# Patient Record
Sex: Female | Born: 1955 | Race: Black or African American | Hispanic: No | Marital: Single | Smoking: Never smoker
Health system: Southern US, Community
[De-identification: ages and names within clinical notes are randomized; demographics above are authoritative.]

## PROBLEM LIST (undated history)

## (undated) DIAGNOSIS — C801 Malignant (primary) neoplasm, unspecified: Secondary | ICD-10-CM

## (undated) DIAGNOSIS — E119 Type 2 diabetes mellitus without complications: Secondary | ICD-10-CM

## (undated) HISTORY — PX: COLON SURGERY: SHX602

## (undated) HISTORY — PX: ABDOMINAL HYSTERECTOMY: SHX81

---

## 2010-09-17 ENCOUNTER — Emergency Department (HOSPITAL_BASED_OUTPATIENT_CLINIC_OR_DEPARTMENT_OTHER): Admission: EM | Admit: 2010-09-17 | Discharge: 2010-09-17 | Payer: Self-pay | Admitting: Emergency Medicine

## 2011-02-28 LAB — URINALYSIS, ROUTINE W REFLEX MICROSCOPIC
Nitrite: POSITIVE — AB
Protein, ur: 30 mg/dL — AB
Specific Gravity, Urine: 1.016 (ref 1.005–1.030)
Urobilinogen, UA: 0.2 mg/dL (ref 0.0–1.0)

## 2011-02-28 LAB — URINE MICROSCOPIC-ADD ON

## 2019-11-21 ENCOUNTER — Emergency Department (HOSPITAL_BASED_OUTPATIENT_CLINIC_OR_DEPARTMENT_OTHER): Payer: Self-pay

## 2019-11-21 ENCOUNTER — Emergency Department (HOSPITAL_BASED_OUTPATIENT_CLINIC_OR_DEPARTMENT_OTHER)
Admission: EM | Admit: 2019-11-21 | Discharge: 2019-11-21 | Disposition: A | Discharge status: Home / Self Care | Payer: Self-pay | Attending: Emergency Medicine | Admitting: Emergency Medicine

## 2019-11-21 ENCOUNTER — Other Ambulatory Visit: Payer: Self-pay

## 2019-11-21 ENCOUNTER — Encounter (HOSPITAL_BASED_OUTPATIENT_CLINIC_OR_DEPARTMENT_OTHER): Payer: Self-pay | Admitting: Emergency Medicine

## 2019-11-21 DIAGNOSIS — R739 Hyperglycemia, unspecified: Secondary | ICD-10-CM

## 2019-11-21 DIAGNOSIS — E1165 Type 2 diabetes mellitus with hyperglycemia: Secondary | ICD-10-CM | POA: Insufficient documentation

## 2019-11-21 DIAGNOSIS — M7918 Myalgia, other site: Secondary | ICD-10-CM | POA: Insufficient documentation

## 2019-11-21 DIAGNOSIS — R109 Unspecified abdominal pain: Secondary | ICD-10-CM | POA: Insufficient documentation

## 2019-11-21 DIAGNOSIS — Z859 Personal history of malignant neoplasm, unspecified: Secondary | ICD-10-CM | POA: Insufficient documentation

## 2019-11-21 HISTORY — DX: Type 2 diabetes mellitus without complications: E11.9

## 2019-11-21 HISTORY — DX: Malignant (primary) neoplasm, unspecified: C80.1

## 2019-11-21 LAB — CBC WITH DIFFERENTIAL/PLATELET
Abs Immature Granulocytes: 0.01 10*3/uL (ref 0.00–0.07)
Basophils Absolute: 0 10*3/uL (ref 0.0–0.1)
Basophils Relative: 0 %
Eosinophils Absolute: 0.1 10*3/uL (ref 0.0–0.5)
Eosinophils Relative: 2 %
HCT: 38.9 % (ref 36.0–46.0)
Hemoglobin: 12 g/dL (ref 12.0–15.0)
Immature Granulocytes: 0 %
Lymphocytes Relative: 25 %
Lymphs Abs: 1.6 10*3/uL (ref 0.7–4.0)
MCH: 27 pg (ref 26.0–34.0)
MCHC: 30.8 g/dL (ref 30.0–36.0)
MCV: 87.6 fL (ref 80.0–100.0)
Monocytes Absolute: 0.3 10*3/uL (ref 0.1–1.0)
Monocytes Relative: 5 %
Neutro Abs: 4.2 10*3/uL (ref 1.7–7.7)
Neutrophils Relative %: 68 %
Platelets: 243 10*3/uL (ref 150–400)
RBC: 4.44 MIL/uL (ref 3.87–5.11)
RDW: 13.3 % (ref 11.5–15.5)
WBC: 6.1 10*3/uL (ref 4.0–10.5)
nRBC: 0 % (ref 0.0–0.2)

## 2019-11-21 LAB — URINALYSIS, ROUTINE W REFLEX MICROSCOPIC
Bilirubin Urine: NEGATIVE
Glucose, UA: >=500 mg/dL — AB
Ketones, ur: 15 mg/dL — AB
Leukocytes,Ua: NEGATIVE
Nitrite: NEGATIVE
Protein, ur: NEGATIVE mg/dL
Specific Gravity, Urine: 1.02 (ref 1.005–1.030)
pH: 6 (ref 5.0–8.0)

## 2019-11-21 LAB — COMPREHENSIVE METABOLIC PANEL
ALT: 14 U/L (ref 0–44)
AST: 15 U/L (ref 15–41)
Albumin: 3.3 g/dL — ABNORMAL LOW (ref 3.5–5.0)
Alkaline Phosphatase: 78 U/L (ref 38–126)
Anion gap: 8 (ref 5–15)
BUN: 10 mg/dL (ref 8–23)
CO2: 27 mmol/L (ref 22–32)
Calcium: 8.7 mg/dL — ABNORMAL LOW (ref 8.9–10.3)
Chloride: 104 mmol/L (ref 98–111)
Creatinine, Ser: 0.84 mg/dL (ref 0.44–1.00)
GFR calc Af Amer: >60 mL/min (ref >60)
GFR calc non Af Amer: >60 mL/min (ref >60)
Glucose, Bld: 327 mg/dL — ABNORMAL HIGH (ref 70–99)
Potassium: 3.7 mmol/L (ref 3.5–5.1)
Sodium: 139 mmol/L (ref 135–145)
Total Bilirubin: 0.6 mg/dL (ref 0.3–1.2)
Total Protein: 7 g/dL (ref 6.5–8.1)

## 2019-11-21 LAB — URINALYSIS, MICROSCOPIC (REFLEX): WBC, UA: NONE SEEN WBC/hpf (ref 0–5)

## 2019-11-21 MED ORDER — MELOXICAM 7.5 MG PO TABS: 7.5000 mg | ORAL_TABLET | Freq: Every day | ORAL | 0 refills | Status: DC

## 2019-11-21 MED ORDER — METHOCARBAMOL 500 MG PO TABS: 1000.0000 mg | ORAL_TABLET | Freq: Four times a day (QID) | ORAL | 0 refills | Status: DC

## 2019-11-21 MED ORDER — ACETAMINOPHEN 325 MG PO TABS
650.0000 mg | ORAL_TABLET | Freq: Once | ORAL | Status: AC
  Administered 2019-11-21: 11:00:00 650 mg via ORAL
  Filled 2019-11-21: qty 2

## 2019-11-21 NOTE — ED Triage Notes (Signed)
L low back pain x 2 weeks. Denies injury, urinary symptoms

## 2019-11-21 NOTE — Discharge Instructions (Signed)
Please read and follow all provided instructions.  Your diagnoses today include:  1. Left flank pain   2. Musculoskeletal pain     Tests performed today include:  Blood counts and electrolytes -- high blood sugar otherwise OK  Urine test - no sign of infection  CT scan -no kidney stone or other signs of problems in the abdomen, no problems with previous surgery  Vital signs. See below for your results today.   Medications prescribed:   Robaxin (methocarbamol) - muscle relaxer medication  DO NOT drive or perform any activities that require you to be awake and alert because this medicine can make you drowsy.    Meloxicam - anti-inflammatory pain medication  You have been prescribed an anti-inflammatory medication or NSAID. Take with food. Do not take aspirin, ibuprofen, or naproxen if taking this medication. Take smallest effective dose for the shortest duration needed for your pain. Stop taking if you experience stomach pain or vomiting.   Take any prescribed medications only as directed.  Home care instructions:  Follow any educational materials contained in this packet.  BE VERY CAREFUL not to take multiple medicines containing Tylenol (also called acetaminophen). Doing so can lead to an overdose which can damage your liver and cause liver failure and possibly death.   Follow-up instructions: Please follow-up with your primary care provider in the next 5 days for further evaluation of your symptoms.   Return instructions:   Please return to the Emergency Department if you experience worsening symptoms.   Return if you have worsening uncontrolled pain, vomiting, blood in the stool or urine, fever.  Please return if you have any other emergent concerns.  Additional Information:  Your vital signs today were: BP (!) 133/91 (BP Location: Right Arm)    Pulse 75    Temp 98 F (36.7 C) (Oral)    Resp 18    Ht 5\' 10"  (1.778 m)    Wt 127 kg    SpO2 99%    BMI 40.18 kg/m  If  your blood pressure (BP) was elevated above 135/85 this visit, please have this repeated by your doctor within one month. --------------

## 2019-11-21 NOTE — ED Notes (Signed)
ED Provider at bedside. 

## 2019-11-21 NOTE — ED Provider Notes (Signed)
[facility] EMERGENCY DEPARTMENT Provider Note   CSN: [id number] Arrival date & time: 11/21/19  1043     History   Chief Complaint Chief Complaint  Patient presents with   Back Pain    HPI Deborah Olsen is a 63 y.o. female.     Patient presents to the emergency department with complaint of left flank and lateral back pain ongoing over the past 2 weeks.  No injuries prior to onset.  Pain has been intermittent, not present daily.  It was worse today prompting emergency department visit.  She denies any fevers, nausea, vomiting, diarrhea, constipation.  No hematuria, dysuria, increased frequency or urgency.  She has been applying heat to the area.  Pain is worse when she moves or walks.  No weakness, numbness, or tingling in the lower extremities.  Patient has a history of surgery on the abdomen for colon cancer as well as an abdominal hysterectomy.     Past Medical History:  Diagnosis Date   Cancer ([place])    Diabetes mellitus without complication ([place])     There are no active problems to display for this patient.   Past Surgical History:  Procedure Laterality Date   ABDOMINAL HYSTERECTOMY     COLON SURGERY       OB History   No obstetric history on file.      Home Medications    Prior to Admission medications   Not on File    Family History No family history on file.  Social History Social History   Tobacco Use   Smoking status: Never Smoker   Smokeless tobacco: Never Used  Substance Use Topics   Alcohol use: Not Currently   Drug use: Never     Allergies   Oxycontin [oxycodone hcl]   Review of Systems Review of Systems  Constitutional: Negative for fever.  HENT: Negative for rhinorrhea and sore throat.   Eyes: Negative for redness.  Respiratory: Negative for cough.   Cardiovascular: Negative for chest pain.  Gastrointestinal: Negative for abdominal pain, diarrhea, nausea and vomiting.  Genitourinary: Positive for flank  pain. Negative for dysuria, frequency and hematuria.  Musculoskeletal: Positive for back pain. Negative for myalgias.  Skin: Negative for rash.  Neurological: Negative for headaches.     Physical Exam Updated Vital Signs BP 134/79 (BP Location: Left Arm)    Pulse 93    Temp 99.1 F (37.3 C) (Oral)    Resp 20    Ht 5\' 10"  (1.778 m)    Wt 127 kg    SpO2 99%    BMI 40.18 kg/m   Physical Exam Vitals signs and nursing note reviewed.  Constitutional:      General: She is in acute distress (Patient appears mildly uncomfortable.).     Appearance: She is well-developed.  HENT:     Head: Normocephalic and atraumatic.  Eyes:     General:        Right eye: No discharge.        Left eye: No discharge.     Conjunctiva/sclera: Conjunctivae normal.  Neck:     Musculoskeletal: Normal range of motion and neck supple.  Cardiovascular:     Rate and Rhythm: Normal rate and regular rhythm.     Heart sounds: Normal heart sounds.  Pulmonary:     Effort: Pulmonary effort is normal.     Breath sounds: Normal breath sounds.  Abdominal:     Palpations: Abdomen is soft.     Tenderness: There  is no abdominal tenderness.  Musculoskeletal:     Cervical back: She exhibits normal range of motion, no tenderness and no bony tenderness.     Thoracic back: She exhibits tenderness. She exhibits no bony tenderness.     Lumbar back: She exhibits tenderness. She exhibits no bony tenderness.       Back:  Skin:    General: Skin is warm and dry.  Neurological:     Mental Status: She is alert.      ED Treatments / Results  Labs (all labs ordered are listed, but only abnormal results are displayed) Labs Reviewed  COMPREHENSIVE METABOLIC PANEL - Abnormal; Notable for the following components:      Result Value   Glucose, Bld 327 (*)    Calcium 8.7 (*)    Albumin 3.3 (*)    All other components within normal limits  URINALYSIS, ROUTINE W REFLEX MICROSCOPIC - Abnormal; Notable for the following components:    Glucose, UA >=500 (*)    Hgb urine dipstick TRACE (*)    Ketones, ur 15 (*)    All other components within normal limits  URINALYSIS, MICROSCOPIC (REFLEX) - Abnormal; Notable for the following components:   Bacteria, UA RARE (*)    All other components within normal limits  CBC WITH DIFFERENTIAL/PLATELET    EKG None  Radiology Ct Renal Stone Study  Result Date: 11/21/2019 CLINICAL DATA:  Left flank pain. EXAM: CT ABDOMEN AND PELVIS WITHOUT CONTRAST TECHNIQUE: Multidetector CT imaging of the abdomen and pelvis was performed following the standard protocol without IV contrast. COMPARISON:  11/06/2018 FINDINGS: Lower chest: No acute abnormality. Hepatobiliary: No focal liver abnormality is seen. No gallstones, gallbladder wall thickening, or biliary dilatation. Pancreas: Unremarkable. No pancreatic ductal dilatation or surrounding inflammatory changes. Spleen: Normal in size without focal abnormality. Adrenals/Urinary Tract: Normal adrenal glands. The kidneys are unremarkable. No kidney stone or hydronephrosis identified bilaterally. Urinary bladder appears unremarkable. Stomach/Bowel: Stomach is within normal limits. Appendix appears normal. No evidence of bowel wall thickening, distention, or inflammatory changes. There is diffuse colonic diverticulosis without acute inflammation. Postop change from colonic resection and anastomosis noted within the left upper quadrant of the abdomen. Adjacent area of chronic fat necrosis is unchanged from previous exam. Vascular/Lymphatic: No significant vascular findings are present. No enlarged abdominal or pelvic lymph nodes. Reproductive: Status post hysterectomy. No adnexal masses. Other: No free fluid or fluid collections. Musculoskeletal: No acute or significant osseous findings. IMPRESSION: 1. No acute findings identified within the abdomen or pelvis. No explanation for patient's left flank pain. 2. Colonic diverticulosis without acute inflammation.  Electronically Signed   By: Kerby Moors M.D.   On: 11/21/2019 12:58    Procedures Procedures (including critical care time)  Medications Ordered in ED Medications  acetaminophen (TYLENOL) tablet 650 mg (650 mg Oral Given 11/21/19 1120)     Initial Impression / Assessment and Plan / ED Course  I have reviewed the triage vital signs and the nursing notes.  Pertinent labs & imaging results that were available during my care of the patient were reviewed by me and considered in my medical decision making (see chart for details).        Patient seen and examined.  She does have tenderness to palpation over the left flank.  No tenderness over the ribs.  This may be musculoskeletal in nature, however she will need imaging to rule out intra-abdominal etiology.  Other potential differentials include kidney stone, descending colitis or diverticulitis, pyelonephritis.  Vital signs reviewed and are as follows: BP 134/79 (BP Location: Left Arm)    Pulse 93    Temp 99.1 F (37.3 C) (Oral)    Resp 20    Ht 5\' 10"  (1.778 m)    Wt 127 kg    SpO2 99%    BMI 40.18 kg/m   1:37 PM CT reviewed.  Results are reassuring.  Lab work-up is reassuring as well as UA.  At this point, leading differential is musculoskeletal pain.  Patient will be given a trial of Robaxin and Mobic.  Encouraged follow-up with her doctor in the next 5 days for recheck.  The patient was urged to return to the Emergency Department immediately with worsening of current symptoms, worsening abdominal pain, persistent vomiting, blood noted in stools, fever, or any other concerns. The patient verbalized understanding.   Patient counseled on proper use of muscle relaxant medication.  They were told not to drink alcohol, drive any vehicle, or do any dangerous activities while taking this medication.  Patient verbalized understanding.   Final Clinical Impressions(s) / ED Diagnoses   Final diagnoses:  Left flank pain  Musculoskeletal  pain  Hyperglycemia without ketosis   Patient with left flank and middle back pain, worse with movement.  Work-up today does not show any intra-abdominal etiologies to explain this patient's symptoms.  She does not have any cough, shortness of breath, or chest pain to suggest thoracic etiology.  We will continue conservative treatment and have patient follow-up with her doctor to ensure improvement.  Return instructions as above.  Hyperglycemia --patient is a known diabetic.  No signs of DKA.     ED Discharge Orders         Ordered    methocarbamol (ROBAXIN) 500 MG tablet  4 times daily     11/21/19 1334    meloxicam (MOBIC) 7.5 MG tablet  Daily     11/21/19 1334           Carlisle Cater, PA-C 11/21/19 1339    Dorie Rank, MD 11/23/19 1433

## 2020-02-27 ENCOUNTER — Emergency Department (HOSPITAL_BASED_OUTPATIENT_CLINIC_OR_DEPARTMENT_OTHER)
Admission: EM | Admit: 2020-02-27 | Discharge: 2020-02-27 | Disposition: A | Discharge status: Home / Self Care | Payer: Self-pay | Attending: Emergency Medicine | Admitting: Emergency Medicine

## 2020-02-27 ENCOUNTER — Other Ambulatory Visit: Payer: Self-pay

## 2020-02-27 ENCOUNTER — Encounter (HOSPITAL_BASED_OUTPATIENT_CLINIC_OR_DEPARTMENT_OTHER): Payer: Self-pay | Admitting: Emergency Medicine

## 2020-02-27 ENCOUNTER — Emergency Department (HOSPITAL_BASED_OUTPATIENT_CLINIC_OR_DEPARTMENT_OTHER): Payer: Self-pay

## 2020-02-27 DIAGNOSIS — E119 Type 2 diabetes mellitus without complications: Secondary | ICD-10-CM | POA: Insufficient documentation

## 2020-02-27 DIAGNOSIS — Z85038 Personal history of other malignant neoplasm of large intestine: Secondary | ICD-10-CM | POA: Insufficient documentation

## 2020-02-27 DIAGNOSIS — M5442 Lumbago with sciatica, left side: Secondary | ICD-10-CM | POA: Insufficient documentation

## 2020-02-27 DIAGNOSIS — Z885 Allergy status to narcotic agent status: Secondary | ICD-10-CM | POA: Insufficient documentation

## 2020-02-27 MED ORDER — MELOXICAM 7.5 MG PO TABS: 15.0000 mg | ORAL_TABLET | Freq: Every day | ORAL | 0 refills | Status: AC

## 2020-02-27 MED ORDER — KETOROLAC TROMETHAMINE 30 MG/ML IJ SOLN
30.0000 mg | Freq: Once | INTRAMUSCULAR | Status: AC
  Administered 2020-02-27: 30 mg via INTRAMUSCULAR
  Filled 2020-02-27: qty 1

## 2020-02-27 MED ORDER — ACETAMINOPHEN 500 MG PO TABS
1000.0000 mg | ORAL_TABLET | Freq: Once | ORAL | Status: AC
  Administered 2020-02-27: 10:00:00 1000 mg via ORAL
  Filled 2020-02-27: qty 2

## 2020-02-27 MED ORDER — CYCLOBENZAPRINE HCL 10 MG PO TABS: 10.0000 mg | ORAL_TABLET | Freq: Three times a day (TID) | ORAL | 0 refills | Status: DC | PRN

## 2020-02-27 MED ORDER — ACETAMINOPHEN 500 MG PO TABS: 1000.0000 mg | ORAL_TABLET | Freq: Four times a day (QID) | ORAL | 0 refills | Status: AC | PRN

## 2020-02-27 MED ORDER — LIDOCAINE 5 % EX PTCH
1.0000 | MEDICATED_PATCH | CUTANEOUS | Status: DC
Start: 2020-02-27 — End: 2020-02-27
  Administered 2020-02-27: 10:00:00 1 via TRANSDERMAL
  Filled 2020-02-27: qty 1

## 2020-02-27 NOTE — ED Triage Notes (Signed)
L low back pain radiating down leg x 2 days. No known injury.

## 2020-02-27 NOTE — ED Provider Notes (Signed)
[facility] EMERGENCY DEPARTMENT Provider Note   CSN: [id number] Arrival date & time: 02/27/20  [id number]     History Chief Complaint  Patient presents with  . Back Pain    Deborah Olsen is a 64 y.o. female.  64 year old female with past medical history including NIDDM and colon cancer who p/w low back pain and left leg pain.  Patient reports 2 days of progressively worsening, persistent left low back pain that radiates down her left leg.  Nothing makes the pain better or worse, she has tried Tylenol, ibuprofen, meloxicam without relief although she has not had any medications this morning.  She denies any associated weakness, numbness, bowel/bladder incontinence, urinary symptoms, fevers, or recent illness.  No preceding change in physical activity, heavy lifting, or falls.  No history of back injury.  No abdominal pain.  She denies IV drug use.  The history is provided by the patient.  Back Pain      Past Medical History:  Diagnosis Date  . Cancer ([place])   . Diabetes mellitus without complication ([place])     There are no problems to display for this patient.   Past Surgical History:  Procedure Laterality Date  . ABDOMINAL HYSTERECTOMY    . COLON SURGERY       OB History   No obstetric history on file.     No family history on file.  Social History   Tobacco Use  . Smoking status: Never Smoker  . Smokeless tobacco: Never Used  Substance Use Topics  . Alcohol use: Not Currently  . Drug use: Never    Home Medications Prior to Admission medications   Medication Sig Start Date End Date Taking? Authorizing Provider  acetaminophen (TYLENOL) 500 MG tablet Take 2 tablets (1,000 mg total) by mouth every 6 (six) hours as needed. 02/27/20   Lummie Montijo, Wenda Overland, MD  cyclobenzaprine (FLEXERIL) 10 MG tablet Take 1 tablet (10 mg total) by mouth 3 (three) times daily as needed for muscle spasms. 02/27/20   Ahja Martello, Wenda Overland, MD  meloxicam (MOBIC) 7.5 MG tablet  Take 2 tablets (15 mg total) by mouth daily for 5 days. 02/27/20 03/03/20  Sherese Heyward, Wenda Overland, MD    Allergies    Oxycontin [oxycodone hcl]  Review of Systems   Review of Systems  Musculoskeletal: Positive for back pain.   All other systems reviewed and are negative except that which was mentioned in HPI  Physical Exam Updated Vital Signs BP (!) 145/87   Pulse 82   Temp 98.9 F (37.2 C) (Oral)   Resp 18   Ht 5\' 10"  (1.778 m)   Wt 127 kg   SpO2 99%   BMI 40.18 kg/m   Physical Exam Vitals and nursing note reviewed.  Constitutional:      General: She is not in acute distress.    Appearance: She is well-developed.     Comments: Sitting on side of bed, uncomfortable  HENT:     Head: Normocephalic and atraumatic.  Eyes:     Conjunctiva/sclera: Conjunctivae normal.  Musculoskeletal:        General: Tenderness present.     Cervical back: Neck supple.       Back:     Right lower leg: No edema.     Left lower leg: No edema.     Comments: Left lumbar paraspinal muscle tenderness, no midline spinal tenderness  Skin:    General: Skin is warm and dry.  Neurological:  Mental Status: She is alert and oriented to person, place, and time.     Sensory: No sensory deficit.     Motor: No weakness.     Deep Tendon Reflexes: Reflexes normal.     Comments: 5/5 strength and normal sensation BLE  Psychiatric:        Judgment: Judgment normal.     ED Results / Procedures / Treatments   Labs (all labs ordered are listed, but only abnormal results are displayed) Labs Reviewed - No data to display  EKG None  Radiology DG Lumbar Spine 2-3 Views  Result Date: 02/27/2020 CLINICAL DATA:  Low back pain radiating down the left leg for 2 days. No injury. EXAM: LUMBAR SPINE - 2-3 VIEW COMPARISON:  11/26/2017 FINDINGS: No fracture.  No bone lesion. Minor anterolisthesis of L4 on L5, approximately 3 mm. No other spondylolisthesis. Mild loss of disc height at L4-L5. Remaining lumbar  discs are well maintained in height. There small anterior endplate osteophytes at L3-L4 through L5-S1. Soft tissues are unremarkable. IMPRESSION: 1. No fracture or acute finding. 2. Mild disc degenerative changes and a slight anterolisthesis of L4 on L5. Electronically Signed   By: Lajean Manes M.D.   On: 02/27/2020 10:03    Procedures Procedures (including critical care time)  Medications Ordered in ED Medications  lidocaine (LIDODERM) 5 % 1 patch (1 patch Transdermal Patch Applied 02/27/20 1021)  ketorolac (TORADOL) 30 MG/ML injection 30 mg (30 mg Intramuscular Given 02/27/20 1022)  acetaminophen (TYLENOL) tablet 1,000 mg (1,000 mg Oral Given 02/27/20 1022)    ED Course  I have reviewed the triage vital signs and the nursing notes.  Pertinent imaging results that were available during my care of the patient were reviewed by me and considered in my medical decision making (see chart for details).    MDM Rules/Calculators/A&P                      Neuro intact on exam. No midline pain. Because of age and h/o cancer, obtained XR which was negative for acute process. Exam and hx suggestive of musculoskeletal pain w/ sciatica. Labs in December show normal creatinine. Gave IM toradol and lidocaine patch. Recommended holding off on steroids given her diabetes.  Patient demonstrates no lower extremity weakness, saddle anesthesia, bowel or bladder incontinence, or any other neurologic deficits concerning for cauda equina. No fevers or other infectious symptoms to suggest by the patient's back pain is due to an infection. I have reviewed return precautions, including the development of any of these signs or symptoms, and the patient has voiced understanding. I reviewed supportive care instructions, including short course of NSAIDs, scheduled tylenol, flexeril prn, lidocaine patches, and PCP follow-up if symptoms do not improve for referral to physical therapy. Patient voiced understanding and was  discharged in satisfactory condition. Final Clinical Impression(s) / ED Diagnoses Final diagnoses:  Acute left-sided low back pain with left-sided sciatica    Rx / DC Orders ED Discharge Orders         Ordered    meloxicam (MOBIC) 7.5 MG tablet  Daily     02/27/20 1016    acetaminophen (TYLENOL) 500 MG tablet  Every 6 hours PRN     02/27/20 1016    cyclobenzaprine (FLEXERIL) 10 MG tablet  3 times daily PRN     02/27/20 1016           Natalye Kott, Wenda Overland, MD 02/27/20 1022

## 2021-11-07 ENCOUNTER — Emergency Department (HOSPITAL_BASED_OUTPATIENT_CLINIC_OR_DEPARTMENT_OTHER): Payer: Self-pay

## 2021-11-07 ENCOUNTER — Encounter (HOSPITAL_BASED_OUTPATIENT_CLINIC_OR_DEPARTMENT_OTHER): Payer: Self-pay | Admitting: *Deleted

## 2021-11-07 ENCOUNTER — Emergency Department (HOSPITAL_BASED_OUTPATIENT_CLINIC_OR_DEPARTMENT_OTHER)
Admission: EM | Admit: 2021-11-07 | Discharge: 2021-11-07 | Disposition: A | Discharge status: Home / Self Care | Payer: Self-pay | Attending: Emergency Medicine | Admitting: Emergency Medicine

## 2021-11-07 ENCOUNTER — Other Ambulatory Visit: Payer: Self-pay

## 2021-11-07 DIAGNOSIS — R0781 Pleurodynia: Secondary | ICD-10-CM | POA: Insufficient documentation

## 2021-11-07 DIAGNOSIS — Z85038 Personal history of other malignant neoplasm of large intestine: Secondary | ICD-10-CM | POA: Insufficient documentation

## 2021-11-07 DIAGNOSIS — N644 Mastodynia: Secondary | ICD-10-CM | POA: Insufficient documentation

## 2021-11-07 DIAGNOSIS — E119 Type 2 diabetes mellitus without complications: Secondary | ICD-10-CM | POA: Insufficient documentation

## 2021-11-07 LAB — CBC WITH DIFFERENTIAL/PLATELET
Abs Immature Granulocytes: 0.03 10*3/uL (ref 0.00–0.07)
Basophils Absolute: 0 10*3/uL (ref 0.0–0.1)
Basophils Relative: 0 %
Eosinophils Absolute: 0.1 10*3/uL (ref 0.0–0.5)
Eosinophils Relative: 1 %
HCT: 40.2 % (ref 36.0–46.0)
Hemoglobin: 12.9 g/dL (ref 12.0–15.0)
Immature Granulocytes: 0 %
Lymphocytes Relative: 26 %
Lymphs Abs: 2.4 10*3/uL (ref 0.7–4.0)
MCH: 27.5 pg (ref 26.0–34.0)
MCHC: 32.1 g/dL (ref 30.0–36.0)
MCV: 85.7 fL (ref 80.0–100.0)
Monocytes Absolute: 0.4 10*3/uL (ref 0.1–1.0)
Monocytes Relative: 5 %
Neutro Abs: 6.2 10*3/uL (ref 1.7–7.7)
Neutrophils Relative %: 68 %
Platelets: 298 10*3/uL (ref 150–400)
RBC: 4.69 MIL/uL (ref 3.87–5.11)
RDW: 12.8 % (ref 11.5–15.5)
WBC: 9.3 10*3/uL (ref 4.0–10.5)
nRBC: 0 % (ref 0.0–0.2)

## 2021-11-07 LAB — COMPREHENSIVE METABOLIC PANEL
ALT: 13 U/L (ref 0–44)
AST: 16 U/L (ref 15–41)
Albumin: 3.6 g/dL (ref 3.5–5.0)
Alkaline Phosphatase: 81 U/L (ref 38–126)
Anion gap: 7 (ref 5–15)
BUN: 13 mg/dL (ref 8–23)
CO2: 27 mmol/L (ref 22–32)
Calcium: 9 mg/dL (ref 8.9–10.3)
Chloride: 105 mmol/L (ref 98–111)
Creatinine, Ser: 0.81 mg/dL (ref 0.44–1.00)
GFR, Estimated: >60 mL/min (ref >60)
Glucose, Bld: 175 mg/dL — ABNORMAL HIGH (ref 70–99)
Potassium: 4 mmol/L (ref 3.5–5.1)
Sodium: 139 mmol/L (ref 135–145)
Total Bilirubin: 0.3 mg/dL (ref 0.3–1.2)
Total Protein: 8.1 g/dL (ref 6.5–8.1)

## 2021-11-07 LAB — LIPASE, BLOOD: Lipase: 32 U/L (ref 11–51)

## 2021-11-07 LAB — D-DIMER, QUANTITATIVE: D-Dimer, Quant: <0.27 ug/mL-FEU (ref 0.00–0.50)

## 2021-11-07 MED ORDER — CYCLOBENZAPRINE HCL 10 MG PO TABS: 10.0000 mg | ORAL_TABLET | Freq: Two times a day (BID) | ORAL | 0 refills | Status: AC | PRN

## 2021-11-07 MED ORDER — CYCLOBENZAPRINE HCL 10 MG PO TABS
10.0000 mg | ORAL_TABLET | Freq: Once | ORAL | Status: AC
  Administered 2021-11-07: 10 mg via ORAL
  Filled 2021-11-07: qty 1

## 2021-11-07 NOTE — ED Triage Notes (Signed)
C/o right upper abd  pain x 3 weeks , seen by PMD , Mammogram and US done , pain cont

## 2021-11-07 NOTE — Discharge Instructions (Signed)
Lab work and imaging are reassuring, likely muscular strain, started on a muscle laxer please take as prescribed.  Continue with over-the-counter pain medication as needed.  Follow-up with PCP for further evaluation.  Come back to the emergency department if you develop chest pain, shortness of breath, severe abdominal pain, uncontrolled nausea, vomiting, diarrhea.

## 2021-11-07 NOTE — ED Provider Notes (Signed)
[facility] EMERGENCY DEPARTMENT Provider Note   CSN: [id number] Arrival date & time: 11/07/21  1739     History Chief Complaint  Patient presents with   Abdominal Pain    Deborah Olsen is a 65 y.o. female.  HPI  Patient with significant medical history including diabetes, transverse colon cancer status postresection, hysterectomy presents to the emergency department with chief complaint of right-sided side pain.  Patient states she has had this pain for last 2 weeks, states the pain came on suddenly, while she is watching TV, describes the pain as a needlelike sensation which is constant, worsened when she applies pressure to the area, states the pain is on her right rib and will go on to her right breast, does not radiate, no associated urinary symptoms, no flank tenderness, no stomach pain, nausea, vomiting, diarrhea.  She denies  subjective fevers or chills, no congestion, sore throat cough, denies pleuritic chest pain, peripheral edema, no significant cardiac history, no history of PEs or DVTs.  She denies discharge home from the breast, no nipple inversions, no history of breast cancer.  Has been worked up by her PCP where she obtained mammograms as well as ultrasound both which were negative for acute findings.  Patient is here because pain  remained constant and has not gotten any better.   Past Medical History:  Diagnosis Date   Cancer ([place])    Diabetes mellitus without complication ([place])     There are no problems to display for this patient.   Past Surgical History:  Procedure Laterality Date   ABDOMINAL HYSTERECTOMY     COLON SURGERY       OB History   No obstetric history on file.     No family history on file.  Social History   Tobacco Use   Smoking status: Never   Smokeless tobacco: Never  Substance Use Topics   Alcohol use: Not Currently   Drug use: Never    Home Medications Prior to Admission medications   Medication Sig Start Date  End Date Taking? Authorizing Provider  cyclobenzaprine (FLEXERIL) 10 MG tablet Take 1 tablet (10 mg total) by mouth 2 (two) times daily as needed for muscle spasms. 11/07/21  Yes Marcello Fennel, PA-C  acetaminophen (TYLENOL) 500 MG tablet Take 2 tablets (1,000 mg total) by mouth every 6 (six) hours as needed. 02/27/20   Little, Wenda Overland, MD    Allergies    Oxycontin [oxycodone hcl]  Review of Systems   Review of Systems  Constitutional:  Negative for chills and fever.  HENT:  Negative for congestion.   Respiratory:  Negative for shortness of breath.   Cardiovascular:  Negative for chest pain.  Gastrointestinal:  Negative for abdominal pain, diarrhea, nausea and vomiting.  Genitourinary:  Negative for dysuria, enuresis and flank pain.  Musculoskeletal:  Negative for back pain.       Pain on the right ribs and breast.  Skin:  Negative for rash.  Neurological:  Negative for dizziness.  Hematological:  Does not bruise/bleed easily.   Physical Exam Updated Vital Signs BP (!) 151/87   Pulse 75   Temp 97.9 F (36.6 C) (Oral)   Resp 17   Ht 6' (1.829 m)   Wt 124.7 kg   SpO2 98%   BMI 37.30 kg/m   Physical Exam Vitals and nursing note reviewed. Exam conducted with a chaperone present.  Constitutional:      General: She is not in acute distress.  Appearance: She is not ill-appearing.  HENT:     Head: Normocephalic and atraumatic.     Nose: No congestion.  Eyes:     Conjunctiva/sclera: Conjunctivae normal.  Cardiovascular:     Rate and Rhythm: Normal rate and regular rhythm.     Pulses: Normal pulses.     Heart sounds: No murmur heard.   No friction rub. No gallop.  Pulmonary:     Effort: No respiratory distress.     Breath sounds: No wheezing, rhonchi or rales.     Comments: Patient has noted tenderness along her chest, is on the mid axial line along the right fourth and fifth ribs no crepitus or deformities present. Chest:     Chest wall: Tenderness present.   Abdominal:     Palpations: Abdomen is soft.     Tenderness: There is no abdominal tenderness. There is no right CVA tenderness or left CVA tenderness.     Comments: Abdomen nondistended, normal bowel sounds, dull to percussion, nontender to palpation, no epigastric tenderness, no Murphy sign or McBurney point, no CVA tenderness.  Musculoskeletal:     Right lower leg: No edema.     Left lower leg: No edema.  Skin:    General: Skin is warm and dry.     Comments: With chaperone present breast exam was performed there is no abnormalities present of the right breast, no nipple inversion, no erythema noted, she was tender on the anterior aspect of the breast above the nipple but there is no gross deformities or palpable mass present.  Neurological:     Mental Status: She is alert.  Psychiatric:        Mood and Affect: Mood normal.    ED Results / Procedures / Treatments   Labs (all labs ordered are listed, but only abnormal results are displayed) Labs Reviewed  COMPREHENSIVE METABOLIC PANEL - Abnormal; Notable for the following components:      Result Value   Glucose, Bld 175 (*)    All other components within normal limits  LIPASE, BLOOD  CBC WITH DIFFERENTIAL/PLATELET  D-DIMER, QUANTITATIVE    EKG EKG Interpretation  Date/Time:  Wednesday November 07 2021 17:58:43 EST Ventricular Rate:  88 PR Interval:  138 QRS Duration: 85 QT Interval:  365 QTC Calculation: 442 R Axis:   11 Text Interpretation: Sinus rhythm Right atrial enlargement Consider anterior infarct No old tracing to compare Confirmed by Calvert Cantor [phone]) on 11/07/2021 6:39:30 PM  Radiology DG Ribs Unilateral W/Chest Right  Result Date: 11/07/2021 CLINICAL DATA:  Right rib pain EXAM: RIGHT RIBS AND CHEST - 3+ VIEW COMPARISON:  11/06/2020 FINDINGS: No fracture or other bone lesions are seen involving the ribs. There is no evidence of pneumothorax or pleural effusion. Both lungs are clear. Heart size and  mediastinal contours are within normal limits. IMPRESSION: Negative. Electronically Signed   By: Davina Poke D.O.   On: 11/07/2021 19:37    Procedures Procedures   Medications Ordered in ED Medications  cyclobenzaprine (FLEXERIL) tablet 10 mg (10 mg Oral Given 11/07/21 1852)    ED Course  I have reviewed the triage vital signs and the nursing notes.  Pertinent labs & imaging results that were available during my care of the patient were reviewed by me and considered in my medical decision making (see chart for details).    MDM Rules/Calculators/A&P  Initial impression-presents with right-sided rib pain.  She is alert, does not appear acute chest, vital signs reassuring.  Unclear etiology but likely this is a muscular nature, will obtain basic lab work-up, add on D-dimer for exclusion of PE and reassess.  Work-up-CBC unremarkable CMP shows hyperglycemia 175, D-dimer less than 0.27, lipase 32, rib x-ray negative for acute findings.  EKG sinus without signs of ischemia.  Reassessment-patient  reassessed after Flexeril, states she is feeling better, she has no complaints this time, patient agreed for discharge.   Rule out- I have low suspicion for ACS as history is atypical, patient has no cardiac history, EKG was sinus rhythm without signs of ischemia, troponins were deferred as she has no chest pain.  low suspicion for PE as patient denies pleuritic chest pain, shortness of breath, patient denies leg pain, no pedal edema noted on exam, D-dimer is negative.  Low suspicion for rib fracture nor pneumothorax as x-rays negative for these findings.  Low suspicion for atypical pneumonia as lung sounds are clear bilaterally, x-ray does not reveal any acute findings.  Low suspicion for cellulitis or abscess within the breast as exam is unremarkable no sign infection present.  Most vision for breast cancer as there is no nipple inversion, no skin changes, she had negative  imaging performed by her PCP.  Low suspicion for UTI, [place], kidney stone as she has no CVA tenderness, no urinary symptoms UA will be deferred at this time.  Plan-  Right sided rib pain-unclear etiology but likely a muscular strain, will recommend continue over-the-counter pain medications, Flexeril, follow with PCP for further evaluation.  Gave strict return precautions.  Vital signs have remained stable, no indication for hospital admission.    Patient given at home care as well strict return precautions.  Patient verbalized that they understood agreed to said plan.  Final Clinical Impression(s) / ED Diagnoses Final diagnoses:  Rib pain on right side    Rx / DC Orders ED Discharge Orders          Ordered    cyclobenzaprine (FLEXERIL) 10 MG tablet  2 times daily PRN        11/07/21 2028             Marcello Fennel, PA-C 11/07/21 2029    Truddie Hidden, MD 11/07/21 [phone]

## 2022-06-07 ENCOUNTER — Encounter (HOSPITAL_BASED_OUTPATIENT_CLINIC_OR_DEPARTMENT_OTHER): Payer: Self-pay | Admitting: Emergency Medicine

## 2022-06-07 ENCOUNTER — Emergency Department (HOSPITAL_BASED_OUTPATIENT_CLINIC_OR_DEPARTMENT_OTHER): Payer: Self-pay

## 2022-06-07 ENCOUNTER — Emergency Department (HOSPITAL_BASED_OUTPATIENT_CLINIC_OR_DEPARTMENT_OTHER)
Admission: EM | Admit: 2022-06-07 | Discharge: 2022-06-07 | Disposition: A | Discharge status: Home / Self Care | Payer: Self-pay | Attending: Emergency Medicine | Admitting: Emergency Medicine

## 2022-06-07 DIAGNOSIS — M5432 Sciatica, left side: Secondary | ICD-10-CM | POA: Insufficient documentation

## 2022-06-07 DIAGNOSIS — R209 Unspecified disturbances of skin sensation: Secondary | ICD-10-CM | POA: Insufficient documentation

## 2022-06-07 DIAGNOSIS — M25552 Pain in left hip: Secondary | ICD-10-CM | POA: Insufficient documentation

## 2022-06-07 DIAGNOSIS — M79605 Pain in left leg: Secondary | ICD-10-CM | POA: Insufficient documentation

## 2022-06-07 MED ORDER — ACETAMINOPHEN 500 MG PO TABS
1000.0000 mg | ORAL_TABLET | Freq: Once | ORAL | Status: AC
  Administered 2022-06-07: 1000 mg via ORAL
  Filled 2022-06-07: qty 2

## 2022-06-07 MED ORDER — METHOCARBAMOL 500 MG PO TABS
500.0000 mg | ORAL_TABLET | Freq: Two times a day (BID) | ORAL | 0 refills | Status: AC
Start: 2022-06-07 — End: ?

## 2022-06-07 MED ORDER — ACETAMINOPHEN 500 MG PO TABS
ORAL_TABLET | ORAL | Status: AC
  Filled 2022-06-07: qty 2

## 2022-06-07 MED ORDER — KETOROLAC TROMETHAMINE 15 MG/ML IJ SOLN
15.0000 mg | Freq: Once | INTRAMUSCULAR | Status: AC
  Administered 2022-06-07: 15 mg via INTRAMUSCULAR
  Filled 2022-06-07: qty 1

## 2022-06-07 NOTE — ED Provider Notes (Signed)
[facility] EMERGENCY DEPARTMENT Provider Note   CSN: [id number] Arrival date & time: 06/07/22  3244     History  Chief Complaint  Patient presents with   Leg Pain   Hip Pain    Deborah Olsen is a 66 y.o. female.  66 yo F with a chief complaints of left leg pain.  This is to the lateral aspect of the leg.  Worse with movement palpation and twisting.  Denies trauma denies loss of bowel or bladder denies loss of sensation denies weakness to the leg.  She does describe some tingling sensation to the leg but no overt numbness.   Leg Pain Hip Pain       Home Medications Prior to Admission medications   Medication Sig Start Date End Date Taking? Authorizing Provider  methocarbamol (ROBAXIN) 500 MG tablet Take 1 tablet (500 mg total) by mouth 2 (two) times daily. 06/07/22  Yes Melene Plan, DO  acetaminophen (TYLENOL) 500 MG tablet Take 2 tablets (1,000 mg total) by mouth every 6 (six) hours as needed. 02/27/20   Little, Ambrose Finland, MD  cyclobenzaprine (FLEXERIL) 10 MG tablet Take 1 tablet (10 mg total) by mouth 2 (two) times daily as needed for muscle spasms. 11/07/21   Carroll Sage, PA-C      Allergies    Oxycontin [oxycodone hcl]    Review of Systems   Review of Systems  Physical Exam Updated Vital Signs BP (!) 137/92 (BP Location: Right Arm)   Pulse 84   Temp 97.8 F (36.6 C) (Oral)   Resp 20   Ht 5\' 11"  (1.803 m)   Wt 113.4 kg   SpO2 100%   BMI 34.87 kg/m  Physical Exam Vitals and nursing note reviewed.  Constitutional:      General: She is not in acute distress.    Appearance: She is well-developed. She is not diaphoretic.  HENT:     Head: Normocephalic and atraumatic.  Eyes:     Pupils: Pupils are equal, round, and reactive to light.  Cardiovascular:     Rate and Rhythm: Normal rate and regular rhythm.     Heart sounds: No murmur heard.    No friction rub. No gallop.  Pulmonary:     Effort: Pulmonary effort is normal.     Breath  sounds: No wheezing or rales.  Abdominal:     General: There is no distension.     Palpations: Abdomen is soft.     Tenderness: There is no abdominal tenderness.  Musculoskeletal:        General: No tenderness.     Cervical back: Normal range of motion and neck supple.     Comments: No obvious midline spinal tenderness step-offs or deformities.  Pulse motor and sensation intact in the left lower extremity.  Reflexes are 2+ and equal.  No clonus.  Negative straight leg raise test.  Pain mostly along the lateral aspect of the thigh.  Skin:    General: Skin is warm and dry.  Neurological:     Mental Status: She is alert and oriented to person, place, and time.     Comments: Painful gait  Psychiatric:        Behavior: Behavior normal.     ED Results / Procedures / Treatments   Labs (all labs ordered are listed, but only abnormal results are displayed) Labs Reviewed - No data to display  EKG None  Radiology CT Renal Stone Study  Result Date: 06/07/2022 CLINICAL  DATA:  Flank pain with kidney stone suspected EXAM: CT ABDOMEN AND PELVIS WITHOUT CONTRAST TECHNIQUE: Multidetector CT imaging of the abdomen and pelvis was performed following the standard protocol without IV contrast. RADIATION DOSE REDUCTION: This exam was performed according to the departmental dose-optimization program which includes automated exposure control, adjustment of the mA and/or kV according to patient size and/or use of iterative reconstruction technique. COMPARISON:  11/21/2019 FINDINGS: Lower chest:  No contributory findings. Hepatobiliary: No focal liver abnormality.Vague increased density in the dependent gallbladder but no definite/calcified stones. No evidence of pericholecystic inflammation. Pancreas: Unremarkable. Spleen: Unremarkable. Adrenals/Urinary Tract: Negative adrenals. No hydronephrosis or stone. Unremarkable bladder. Stomach/Bowel: No obstruction. No appendicitis. Colo colonic anastomosis in the left  upper quadrant. Scattered colonic diverticula. Vascular/Lymphatic: No acute vascular abnormality. Atheromatous calcification which is mild. No mass or adenopathy. Reproductive:Hysterectomy. Other: No ascites or pneumoperitoneum. Musculoskeletal: No acute finding. Dedicated lumbar spine reformats described separately. IMPRESSION: No acute finding.  No hydronephrosis or urolithiasis. Electronically Signed   By: Tiburcio Pea M.D.   On: 06/07/2022 08:05   CT L-SPINE NO CHARGE  Result Date: 06/07/2022 CLINICAL DATA:  Pain on left side with left leg pain from hip to foot. Left foot numbness EXAM: CT Lumbar Spine without contrast TECHNIQUE: Technique: Multiplanar CT images of the lumbar spine were reconstructed from contemporary CT of the Abdomen and Pelvis. RADIATION DOSE REDUCTION: This exam was performed according to the departmental dose-optimization program which includes automated exposure control, adjustment of the mA and/or kV according to patient size and/or use of iterative reconstruction technique. CONTRAST:  None COMPARISON:  None similar FINDINGS: Segmentation: 5 lumbar type vertebrae Alignment: Slight anterolisthesis at L4-5. Vertebrae: No evidence of fracture or bone lesion. Paraspinal and other soft tissues: Reported separately Disc levels: Spondylitic spurring at L2-3 to L5-S1. Degenerative facet spurring which is moderate to advanced at L2-3 and below. The worst spurring is at L3-4 and L4-5. At L4-5 there is left more than right subarticular recess narrowing. Diffusely patent spinal canal IMPRESSION: 1. No acute finding. 2. Lumbar spine degeneration especially affecting facets. Electronically Signed   By: Tiburcio Pea M.D.   On: 06/07/2022 08:01    Procedures Procedures    Medications Ordered in ED Medications  ketorolac (TORADOL) 15 MG/ML injection 15 mg (15 mg Intramuscular Given 06/07/22 0721)  acetaminophen (TYLENOL) tablet 1,000 mg (1,000 mg Oral Given 06/07/22 1610)    ED Course/  Medical Decision Making/ A&P                           Medical Decision Making Amount and/or Complexity of Data Reviewed Radiology: ordered.  Risk OTC drugs. Prescription drug management.   66 yo F with a chief complaints of left leg pain.  Most likely this is sciatica by history and physical.  Could be IT band syndrome but less likely.  Could also be greater trochanter bursitis.  Patient with a history of colon cancer, remote but has had some radiation of pain around to the front for some time.  No recent CT imaging.  We will obtain a CT stone study to evaluate for possible intra-abdominal pathology or mets to the spine.  My independent interpretation of the CT scan without obvious mets to the spine or fracture.  Radiology read is unremarkable.  We will have the patient follow-up with her pain management doctor.  PDMP reviewed without significant narcotic use.  8:15 AM:  I have discussed the diagnosis/risks/treatment options with  the patient.  Evaluation and diagnostic testing in the emergency department does not suggest an emergent condition requiring admission or immediate intervention beyond what has been performed at this time.  They will follow up with  PCP. We also discussed returning to the ED immediately if new or worsening sx occur. We discussed the sx which are most concerning (e.g., sudden worsening pain, fever, inability to tolerate by mouth) that necessitate immediate return. Medications administered to the patient during their visit and any new prescriptions provided to the patient are listed below.  Medications given during this visit Medications  ketorolac (TORADOL) 15 MG/ML injection 15 mg (15 mg Intramuscular Given 06/07/22 0721)  acetaminophen (TYLENOL) tablet 1,000 mg (1,000 mg Oral Given 06/07/22 5621)     The patient appears reasonably screen and/or stabilized for discharge and I doubt any other medical condition or other [facility] requiring further screening, evaluation, or  treatment in the ED at this time prior to discharge.          Final Clinical Impression(s) / ED Diagnoses Final diagnoses:  Sciatica of left side    Rx / DC Orders ED Discharge Orders          Ordered    methocarbamol (ROBAXIN) 500 MG tablet  2 times daily        06/07/22 0808              Melene Plan, DO 06/07/22 0815

## 2022-07-25 ENCOUNTER — Other Ambulatory Visit: Payer: Self-pay

## 2022-07-25 ENCOUNTER — Emergency Department (HOSPITAL_BASED_OUTPATIENT_CLINIC_OR_DEPARTMENT_OTHER): Payer: Commercial Managed Care - HMO

## 2022-07-25 ENCOUNTER — Encounter (HOSPITAL_BASED_OUTPATIENT_CLINIC_OR_DEPARTMENT_OTHER): Payer: Self-pay

## 2022-07-25 ENCOUNTER — Emergency Department (HOSPITAL_BASED_OUTPATIENT_CLINIC_OR_DEPARTMENT_OTHER)
Admission: EM | Admit: 2022-07-25 | Discharge: 2022-07-25 | Disposition: A | Discharge status: Home / Self Care | Payer: Commercial Managed Care - HMO | Attending: Emergency Medicine | Admitting: Emergency Medicine

## 2022-07-25 DIAGNOSIS — M79672 Pain in left foot: Secondary | ICD-10-CM | POA: Diagnosis not present

## 2022-07-25 DIAGNOSIS — M79671 Pain in right foot: Secondary | ICD-10-CM | POA: Insufficient documentation

## 2022-07-25 DIAGNOSIS — E1140 Type 2 diabetes mellitus with diabetic neuropathy, unspecified: Secondary | ICD-10-CM | POA: Insufficient documentation

## 2022-07-25 MED ORDER — GABAPENTIN 100 MG PO CAPS
100.0000 mg | ORAL_CAPSULE | Freq: Three times a day (TID) | ORAL | 0 refills | Status: DC
Start: 2022-07-25 — End: 2022-08-06

## 2022-07-25 MED ORDER — GABAPENTIN 100 MG PO CAPS
100.0000 mg | ORAL_CAPSULE | Freq: Three times a day (TID) | ORAL | Status: DC
  Administered 2022-07-25: 100 mg via ORAL
  Filled 2022-07-25: qty 1

## 2022-07-25 NOTE — ED Notes (Signed)
Patient is alert x 4. States that she has pain from calf to foot both legs . Denies any injury . Full movement both legs

## 2022-07-25 NOTE — ED Triage Notes (Signed)
States bilateral foot pain and left calf pain x 3 weeks. States she thinks she has neuropathy. Hx diabetes. Denies swelling or known injury. Pain worse with ambulation.

## 2022-07-25 NOTE — Discharge Instructions (Signed)
Please start taking gabapentin 100 mg 3 times a day. Please schedule follow-up appointment with your primary care doctor. I have also provided you with a neurology referral.  You can call this number to schedule an appointment.

## 2022-07-25 NOTE — ED Provider Notes (Signed)
[facility] EMERGENCY DEPARTMENT Provider Note   CSN: [id number] Arrival date & time: 07/25/22  0909     History PMH: DM type 2, Sciatica Chief Complaint  Patient presents with   Foot Pain    Deborah Olsen is a 66 y.o. female. Presents to the ED with bilateral leg pain.  Says about 3 weeks ago she noticed pain in her left calf and now is feeling like it is in her left foot.  She says about a week ago she started noticing a similar pain in her right foot.  She says it feels like numbness or pins and needle sensations. Pain is constant. She has tried multiple modalities including heating pad, Tylenol, and her home amtriptiline.  She states that she is followed by endocrinology, however they have told her that they do not take care of neuropathy. She denies any leg swelling, redness, or warmth, denies recent travel, history of blood clots.    She reports that she is on Gabapentin, but I am finding no record of this in her charts including after review of her Endocrinology notes. She has no idea what dose she is on and states that she will give me a call when she gets home.     Foot Pain       Home Medications Prior to Admission medications   Medication Sig Start Date End Date Taking? Authorizing Provider  gabapentin (NEURONTIN) 100 MG capsule Take 1 capsule (100 mg total) by mouth 3 (three) times daily for 14 days. 07/25/22 08/08/22 Yes Kresta Templeman, Adora Fridge, PA-C  acetaminophen (TYLENOL) 500 MG tablet Take 2 tablets (1,000 mg total) by mouth every 6 (six) hours as needed. 02/27/20   Little, Wenda Overland, MD  cyclobenzaprine (FLEXERIL) 10 MG tablet Take 1 tablet (10 mg total) by mouth 2 (two) times daily as needed for muscle spasms. 11/07/21   Marcello Fennel, PA-C  methocarbamol (ROBAXIN) 500 MG tablet Take 1 tablet (500 mg total) by mouth 2 (two) times daily. 06/07/22   Deno Etienne, DO      Allergies    Oxycontin [oxycodone hcl]    Review of Systems   Review of  Systems  Cardiovascular:  Negative for leg swelling.  Musculoskeletal:        Bilateral leg pain  All other systems reviewed and are negative.   Physical Exam Updated Vital Signs BP (!) 157/85 (BP Location: Left Arm)   Pulse 87   Temp 98.1 F (36.7 C) (Oral)   Resp 18   Ht '5\' 11"'$  (1.803 m)   Wt 113.4 kg   SpO2 98%   BMI 34.87 kg/m  Physical Exam Vitals and nursing note reviewed.  Constitutional:      General: She is not in acute distress.    Appearance: Normal appearance. She is well-developed. She is not ill-appearing, toxic-appearing or diaphoretic.  HENT:     Head: Normocephalic and atraumatic.     Nose: No nasal deformity.     Mouth/Throat:     Lips: Pink. No lesions.  Eyes:     General: Gaze aligned appropriately. No scleral icterus.       Right eye: No discharge.        Left eye: No discharge.     Conjunctiva/sclera: Conjunctivae normal.     Right eye: Right conjunctiva is not injected. No exudate or hemorrhage.    Left eye: Left conjunctiva is not injected. No exudate or hemorrhage. Pulmonary:     Effort: Pulmonary effort  is normal. No respiratory distress.  Musculoskeletal:     Comments: Patient has no calf swelling, redness, or warmth.  She does have tenderness upon the left posterior calf to palpation.  She also has tenderness in bilateral feet.  Bilateral feet with no swelling, redness, or warmth either.  She has 2+ pedal pulses bilaterally with equal sensation.  Skin:    General: Skin is warm and dry.  Neurological:     Mental Status: She is alert and oriented to person, place, and time.  Psychiatric:        Mood and Affect: Mood normal.        Speech: Speech normal.        Behavior: Behavior normal. Behavior is cooperative.     ED Results / Procedures / Treatments   Labs (all labs ordered are listed, but only abnormal results are displayed) Labs Reviewed - No data to display  EKG None  Radiology US Venous Img Lower Bilateral (DVT)  Result  Date: 07/25/2022 CLINICAL DATA:  66 year old female with bilateral calf and foot pain EXAM: BILATERAL LOWER EXTREMITY VENOUS DOPPLER ULTRASOUND TECHNIQUE: Gray-scale sonography with graded compression, as well as color Doppler and duplex ultrasound were performed to evaluate the lower extremity deep venous systems from the level of the common femoral vein and including the common femoral, femoral, profunda femoral, popliteal and calf veins including the posterior tibial, peroneal and gastrocnemius veins when visible. The superficial great saphenous vein was also interrogated. Spectral Doppler was utilized to evaluate flow at rest and with distal augmentation maneuvers in the common femoral, femoral and popliteal veins. COMPARISON:  None Available. FINDINGS: RIGHT LOWER EXTREMITY Common Femoral Vein: No evidence of thrombus. Normal compressibility, respiratory phasicity and response to augmentation. Saphenofemoral Junction: No evidence of thrombus. Normal compressibility and flow on color Doppler imaging. Profunda Femoral Vein: No evidence of thrombus. Normal compressibility and flow on color Doppler imaging. Femoral Vein: No evidence of thrombus. Normal compressibility, respiratory phasicity and response to augmentation. Popliteal Vein: No evidence of thrombus. Normal compressibility, respiratory phasicity and response to augmentation. Calf Veins: No evidence of thrombus. Normal compressibility and flow on color Doppler imaging. Superficial Great Saphenous Vein: No evidence of thrombus. Normal compressibility and flow on color Doppler imaging. Other Findings:  None. LEFT LOWER EXTREMITY Common Femoral Vein: No evidence of thrombus. Normal compressibility, respiratory phasicity and response to augmentation. Saphenofemoral Junction: No evidence of thrombus. Normal compressibility and flow on color Doppler imaging. Profunda Femoral Vein: No evidence of thrombus. Normal compressibility and flow on color Doppler imaging.  Femoral Vein: No evidence of thrombus. Normal compressibility, respiratory phasicity and response to augmentation. Popliteal Vein: No evidence of thrombus. Normal compressibility, respiratory phasicity and response to augmentation. Calf Veins: No evidence of thrombus. Normal compressibility and flow on color Doppler imaging. Superficial Great Saphenous Vein: No evidence of thrombus. Normal compressibility and flow on color Doppler imaging. Other Findings:  None. IMPRESSION: Directed duplex of the bilateral lower extremity negative for DVT Signed, Dulcy Fanny. Nadene Rubins, RPVI [facility] Electronically Signed   By: Corrie Mckusick D.O.   On: 07/25/2022 12:44    Procedures Procedures    Medications Ordered in ED Medications  gabapentin (NEURONTIN) capsule 100 mg (100 mg Oral Given 07/25/22 1056)    ED Course/ Medical Decision Making/ A&P                           Medical Decision  Making Risk Prescription drug management.   Patient is presenting with bilateral lower extremity pain that seems consistent with neuropathy given history of diabetes. She does have some abnormal calf pain in the left calf so will evaluate with DVT study, however this seems less likely. She has no other systemic symptoms that are concerning. I will give her a single dose of gabapentin here.   The DVT study returned negative.  I feel that there is no benefit to any further workup here in the emergency department.  Patient needs to follow-up with her PCP for further management of her neuropathy.  I have also given her a neurology referral.   Final Clinical Impression(s) / ED Diagnoses Final diagnoses:  Bilateral foot pain    Rx / DC Orders ED Discharge Orders          Ordered    gabapentin (NEURONTIN) 100 MG capsule  3 times daily        07/25/22 1302              Lamoyne Hessel, Adora Fridge, PA-C 07/25/22 1304    Charlesetta Shanks, MD 07/25/22  2152

## 2022-08-06 ENCOUNTER — Encounter: Payer: Self-pay | Admitting: Diagnostic Neuroimaging

## 2022-08-06 ENCOUNTER — Ambulatory Visit: Payer: Commercial Managed Care - HMO | Admitting: Diagnostic Neuroimaging

## 2022-08-06 VITALS — BP 128/86 | HR 82 | Ht 71.0 in | Wt 261.2 lb

## 2022-08-06 DIAGNOSIS — E1142 Type 2 diabetes mellitus with diabetic polyneuropathy: Secondary | ICD-10-CM | POA: Diagnosis not present

## 2022-08-06 DIAGNOSIS — M5416 Radiculopathy, lumbar region: Secondary | ICD-10-CM | POA: Diagnosis not present

## 2022-08-06 MED ORDER — GABAPENTIN 300 MG PO CAPS: 300.0000 mg | ORAL_CAPSULE | Freq: Three times a day (TID) | ORAL | 6 refills | Status: AC

## 2022-08-06 NOTE — Patient Instructions (Addendum)
  Diabetic neuropathy  - continue gabapentin '300mg'$  at bedtime; increase to '300mg'$  three times a day  - continue amitriptyline '10mg'$  at bedtime  - follow up with pain mgmt  - continue diabetes control  - consider capsaicin cream, lidocaine patch / cream, alpha-lipoic acid '600mg'$  daily   LUMBAR RADICUALOPATHY (left side) - follow up with pain mgmt

## 2022-08-06 NOTE — Progress Notes (Signed)
[facility]  PATIENT: Deborah Olsen DOB: [birth year]  REFERRING CLINICIAN: Adolphus Birchwood, PA-C HISTORY FROM: patient  REASON FOR VISIT: new consult    HISTORICAL  CHIEF COMPLAINT:  Chief Complaint  Patient presents with   New Patient (Initial Visit)    Pt is fine but she states she is in pain. She states she goes to the ED to get pain shots. She reports she feels the pain and tingling sensation in feet up to her leg. She also reports she has numbness in toes. Room 6 alone    HISTORY OF PRESENT ILLNESS:   66 year old female here for evaluation of numbness and pain in feet.  History of diabetes with suboptimal control and hemoglobin A1c greater than 11.  Has been taking gabapentin with mild relief.  Also has been seeing pain management specialist for epidural steroid injections with mild relief.   REVIEW OF SYSTEMS: Full 14 system review of systems performed and negative with exception of: as per HPI.  ALLERGIES: Allergies  Allergen Reactions   Oxycontin [Oxycodone Hcl]     HOME MEDICATIONS: Outpatient Medications Prior to Visit  Medication Sig Dispense Refill   acetaminophen (TYLENOL) 500 MG tablet Take 2 tablets (1,000 mg total) by mouth every 6 (six) hours as needed. 30 tablet 0   amitriptyline (ELAVIL) 10 MG tablet Take 10 mg by mouth at bedtime.     cyclobenzaprine (FLEXERIL) 10 MG tablet Take 1 tablet (10 mg total) by mouth 2 (two) times daily as needed for muscle spasms. 20 tablet 0   empagliflozin (JARDIANCE) 25 MG TABS tablet Take 1 tablet by mouth daily.     insulin degludec (TRESIBA) 200 UNIT/ML FlexTouch Pen Inject 20 Units into the skin daily.     meloxicam (MOBIC) 15 MG tablet Take 15 mg by mouth daily.     metFORMIN (GLUCOPHAGE) 1000 MG tablet Take 1,000 mg by mouth daily.     methocarbamol (ROBAXIN) 500 MG tablet Take 1 tablet (500 mg total) by mouth 2 (two) times daily. 20 tablet 0   gabapentin (NEURONTIN) 300 MG capsule Take by  mouth at bedtime.     gabapentin (NEURONTIN) 100 MG capsule Take 1 capsule (100 mg total) by mouth 3 (three) times daily for 14 days. (Patient not taking: Reported on 08/06/2022) 42 capsule 0   No facility-administered medications prior to visit.    PAST MEDICAL HISTORY: Past Medical History:  Diagnosis Date   Cancer ([place])    Diabetes mellitus without complication ([place])     PAST SURGICAL HISTORY: Past Surgical History:  Procedure Laterality Date   ABDOMINAL HYSTERECTOMY     COLON SURGERY      FAMILY HISTORY: No family history on file.  SOCIAL HISTORY: Social History   Socioeconomic History   Marital status: Single    Spouse name: Not on file   Number of children: Not on file   Years of education: Not on file   Highest education level: Not on file  Occupational History   Not on file  Tobacco Use   Smoking status: Never   Smokeless tobacco: Never  Substance and Sexual Activity   Alcohol use: Not Currently   Drug use: Never   Sexual activity: Not on file  Other Topics Concern   Not on file  Social History Narrative   Not on file   Social Determinants of Health   Financial Resource Strain: Not on file  Food Insecurity: Not on file  Transportation Needs: Not on  file  Physical Activity: Not on file  Stress: Not on file  Social Connections: Not on file  Intimate Partner Violence: Not on file     PHYSICAL EXAM  GENERAL EXAM/CONSTITUTIONAL: Vitals:  Vitals:   08/06/22 1010  BP: 128/86  Pulse: 82  Weight: 261 lb 4 oz (118.5 kg)  Height: '5\' 11"'$  (1.803 m)   Body mass index is 36.44 kg/m. Wt Readings from Last 3 Encounters:  08/06/22 261 lb 4 oz (118.5 kg)  07/25/22 250 lb (113.4 kg)  06/07/22 250 lb (113.4 kg)   Patient is in no distress; well developed, nourished and groomed; neck is supple  CARDIOVASCULAR: Examination of carotid arteries is normal; no carotid bruits Regular rate and rhythm, no murmurs Examination of peripheral vascular system by  observation and palpation is normal  EYES: Ophthalmoscopic exam of optic discs and posterior segments is normal; no papilledema or hemorrhages No results found.  MUSCULOSKELETAL: Gait, strength, tone, movements noted in Neurologic exam below  NEUROLOGIC: MENTAL STATUS:      No data to display         awake, alert, oriented to person, place and time recent and remote memory intact normal attention and concentration language fluent, comprehension intact, naming intact fund of knowledge appropriate  CRANIAL NERVE:  2nd - no papilledema on fundoscopic exam 2nd, 3rd, 4th, 6th - pupils equal and reactive to light, visual fields full to confrontation, extraocular muscles intact, no nystagmus 5th - facial sensation symmetric 7th - facial strength symmetric 8th - hearing intact 9th - palate elevates symmetrically, uvula midline 11th - shoulder shrug symmetric 12th - tongue protrusion midline  MOTOR:  normal bulk and tone, full strength in the BUE, BLE  SENSORY:  normal and symmetric to light touch, pinprick, temperature, vibration; EXCEPT DECR IN LLE AND BILATERAL FEET  COORDINATION:  finger-nose-finger, fine finger movements normal  REFLEXES:  deep tendon reflexes TRACE and symmetric  GAIT/STATION:  narrow based gait; ANTALGIC     DIAGNOSTIC DATA (LABS, IMAGING, TESTING) - I reviewed patient records, labs, notes, testing and imaging myself where available.  Lab Results  Component Value Date   WBC 9.3 11/07/2021   HGB 12.9 11/07/2021   HCT 40.2 11/07/2021   MCV 85.7 11/07/2021   PLT 298 11/07/2021      Component Value Date/Time   NA 139 11/07/2021 1857   K 4.0 11/07/2021 1857   CL 105 11/07/2021 1857   CO2 27 11/07/2021 1857   GLUCOSE 175 (H) 11/07/2021 1857   BUN 13 11/07/2021 1857   CREATININE 0.81 11/07/2021 1857   CALCIUM 9.0 11/07/2021 1857   PROT 8.1 11/07/2021 1857   ALBUMIN 3.6 11/07/2021 1857   AST 16 11/07/2021 1857   ALT 13 11/07/2021 1857    ALKPHOS 81 11/07/2021 1857   BILITOT 0.3 11/07/2021 1857   GFRNONAA >60 11/07/2021 1857   GFRAA >60 11/21/2019 1117   No results found for: "CHOL", "HDL", "LDLCALC", "LDLDIRECT", "TRIG", "CHOLHDL" No results found for: "HGBA1C" No results found for: "VITAMINB12" No results found for: "TSH"  06/26/22   Ref Range & Units 1 mo ago  HEMOGLOBIN A1C, POC 4.1 - 5.7 % 11.2 Abnormal      06/07/22 CT lumbar spine 1. No acute finding. 2. Lumbar spine degeneration especially affecting facets.    ASSESSMENT AND PLAN  66 y.o. year old female here with:   Dx:  1. Diabetic polyneuropathy associated with type 2 diabetes mellitus ([place])   2. Left lumbar radiculopathy  PLAN:  Diabetic neuropathy (pain in feet; numbness, shooting, burning) - continue gabapentin '300mg'$  at bedtime; increase to '300mg'$  three times a day - continue amitriptyline '10mg'$  at bedtime - follow up with pain mgmt - continue diabetes control - consider capsaicin cream, lidocaine patch / cream, alpha-lipoic acid '600mg'$  daily  LUMBAR RADICULOPATHY (left side) - follow up with pain mgmt  Meds ordered this encounter  Medications   gabapentin (NEURONTIN) 300 MG capsule    Sig: Take 1 capsule (300 mg total) by mouth 3 (three) times daily.    Dispense:  90 capsule    Refill:  6   Return for return to PCP. AND PAIN MGMT    Penni Bombard, MD 3/90/3009, 23:30 AM Certified in Neurology, Neurophysiology and Neuroimaging  [facility] [address], [place], [place] [address] [phone]

## 2022-09-09 ENCOUNTER — Ambulatory Visit: Payer: Commercial Managed Care - HMO | Admitting: Neurology

## 2022-09-16 ENCOUNTER — Ambulatory Visit: Payer: Commercial Managed Care - HMO | Admitting: Diagnostic Neuroimaging

## 2022-09-18 ENCOUNTER — Encounter: Payer: Self-pay | Admitting: *Deleted

## 2023-08-28 ENCOUNTER — Emergency Department (HOSPITAL_BASED_OUTPATIENT_CLINIC_OR_DEPARTMENT_OTHER)
Admission: EM | Admit: 2023-08-28 | Discharge: 2023-08-28 | Disposition: A | Discharge status: Home / Self Care | Payer: Medicare HMO | Attending: Emergency Medicine | Admitting: Emergency Medicine

## 2023-08-28 ENCOUNTER — Encounter (HOSPITAL_BASED_OUTPATIENT_CLINIC_OR_DEPARTMENT_OTHER): Payer: Self-pay | Admitting: Pediatrics

## 2023-08-28 ENCOUNTER — Other Ambulatory Visit: Payer: Self-pay

## 2023-08-28 DIAGNOSIS — U071 COVID-19: Secondary | ICD-10-CM | POA: Diagnosis not present

## 2023-08-28 DIAGNOSIS — R059 Cough, unspecified: Secondary | ICD-10-CM | POA: Diagnosis present

## 2023-08-28 LAB — SARS CORONAVIRUS 2 BY RT PCR: SARS Coronavirus 2 by RT PCR: POSITIVE — AB

## 2023-08-28 MED ORDER — IBUPROFEN 400 MG PO TABS
400.0000 mg | ORAL_TABLET | Freq: Once | ORAL | Status: AC | PRN
  Administered 2023-08-28: 400 mg via ORAL
  Filled 2023-08-28: qty 1

## 2023-08-28 MED ORDER — MOLNUPIRAVIR EUA 200MG CAPSULE: 4.0000 | ORAL_CAPSULE | Freq: Two times a day (BID) | ORAL | 0 refills | Status: AC

## 2023-08-28 NOTE — ED Triage Notes (Signed)
C/O generalized body aches started around 4am with cough and congestion.

## 2023-08-28 NOTE — ED Notes (Signed)
Discharge paperwork reviewed entirely with patient, including follow up care. Pain was under control. The patient received instruction and coaching on their prescriptions, and all follow-up questions were answered.  Pt verbalized understanding as well as all parties involved. No questions or concerns voiced at the time of discharge. No acute distress noted.   Pt ambulated out to PVA without incident or assistance.  

## 2023-08-28 NOTE — ED Provider Notes (Signed)
[place] EMERGENCY DEPARTMENT AT [facility] Provider Note   CSN: [id number] Arrival date & time: 08/28/23  1632     History Chief Complaint  Patient presents with   Generalized Body Aches    Deborah Olsen is a 67 y.o. female patient who presents to the emergency department with chills, myalgias, general malaise, cough, and nasal congestion.  This started this morning.  She denies any obvious fever, abdominal pain, nausea, vomiting, diarrhea.  HPI     Home Medications Prior to Admission medications   Medication Sig Start Date End Date Taking? Authorizing Provider  molnupiravir EUA (LAGEVRIO) 200 mg CAPS capsule Take 4 capsules (800 mg total) by mouth 2 (two) times daily for 5 days. 08/28/23 09/02/23 Yes Bartosz Luginbill M, PA-C  acetaminophen (TYLENOL) 500 MG tablet Take 2 tablets (1,000 mg total) by mouth every 6 (six) hours as needed. 02/27/20   Little, Ambrose Finland, MD  amitriptyline (ELAVIL) 10 MG tablet Take 10 mg by mouth at bedtime. 05/28/22   [provider]  cyclobenzaprine (FLEXERIL) 10 MG tablet Take 1 tablet (10 mg total) by mouth 2 (two) times daily as needed for muscle spasms. 11/07/21   Carroll Sage, PA-C  empagliflozin (JARDIANCE) 25 MG TABS tablet Take 1 tablet by mouth daily. 05/04/21   [provider]  gabapentin (NEURONTIN) 300 MG capsule Take 1 capsule (300 mg total) by mouth 3 (three) times daily. 08/06/22   Penumalli, Glenford Bayley, MD  insulin degludec (TRESIBA) 200 UNIT/ML FlexTouch Pen Inject 20 Units into the skin daily. 07/25/22   [provider]  meloxicam (MOBIC) 15 MG tablet Take 15 mg by mouth daily. 05/28/22   [provider]  metFORMIN (GLUCOPHAGE) 1000 MG tablet Take 1,000 mg by mouth daily. 01/25/21   [provider]  methocarbamol (ROBAXIN) 500 MG tablet Take 1 tablet (500 mg total) by mouth 2 (two) times daily. 06/07/22   Melene Plan, DO      Allergies    Oxycontin [oxycodone hcl]    Review  of Systems   Review of Systems  All other systems reviewed and are negative.   Physical Exam Updated Vital Signs BP (!) 162/86   Pulse (!) 107   Temp 99.6 F (37.6 C) (Oral)   Resp 18   Ht 5\' 10"  (1.778 m)   Wt 113.4 kg   SpO2 100%   BMI 35.87 kg/m  Physical Exam Vitals and nursing note reviewed.  Constitutional:      General: She is not in acute distress.    Appearance: Normal appearance.  HENT:     Head: Normocephalic and atraumatic.  Eyes:     General:        Right eye: No discharge.        Left eye: No discharge.  Cardiovascular:     Rate and Rhythm: Tachycardia present.     Pulses: No decreased pulses.     Heart sounds: Normal heart sounds.  Pulmonary:     Comments: Clear to auscultation bilaterally.  Normal effort.  No respiratory distress.  No evidence of wheezes, rales, or rhonchi heard throughout. Abdominal:     General: Abdomen is flat. Bowel sounds are normal. There is no distension.     Tenderness: There is no abdominal tenderness. There is no guarding or rebound.  Musculoskeletal:        General: Normal range of motion.     Cervical back: Neck supple.  Skin:    General: Skin is  warm and dry.     Findings: No rash.  Neurological:     General: No focal deficit present.     Mental Status: She is alert.  Psychiatric:        Mood and Affect: Mood normal.        Behavior: Behavior normal.     ED Results / Procedures / Treatments   Labs (all labs ordered are listed, but only abnormal results are displayed) Labs Reviewed  SARS CORONAVIRUS 2 BY RT PCR - Abnormal; Notable for the following components:      Result Value   SARS Coronavirus 2 by RT PCR POSITIVE (*)    All other components within normal limits    EKG None  Radiology No results found.  Procedures Procedures    Medications Ordered in ED Medications  ibuprofen (ADVIL) tablet 400 mg (400 mg Oral Given 08/28/23 1643)    ED Course/ Medical Decision Making/ A&P   {   Click here  for ABCD2, HEART and other calculators  Medical Decision Making METRA MOST is a 67 y.o. female patient who presents to the emergency department today for further evaluation of URI type symptoms.  Respiratory panel was obtained in triage interpreted by myself.  She tested positive for COVID today which is likely the source of her symptoms.  Ibuprofen was given initially for mild fever and aches and pains.  Tachycardia is likely secondary to fever.  Will treat with over-the-counter medications like Mucinex and DayQuil and NyQuil.  I am also going to give the patient a prescription for molnupiravir given her comorbidities.  Strict return precautions were discussed.  She is safe for discharge.   Risk Prescription drug management.    Final Clinical Impression(s) / ED Diagnoses Final diagnoses:  COVID    Rx / DC Orders ED Discharge Orders          Ordered    molnupiravir EUA (LAGEVRIO) 200 mg CAPS capsule  2 times daily        08/28/23 1845              Teressa Lower, PA-C 08/28/23 1848    Sloan Leiter, DO 08/31/23 2352

## 2023-08-28 NOTE — Discharge Instructions (Addendum)
As we discussed, you were diagnosed with COVID today.  This is a virus that needs to resolve on its own.  I sent you molnupiravir which is an antiviral which should help shorten the duration given your other medical problems.  Please take over-the-counter DayQuil/NyQuil/Mucinex for your symptoms.  Tylenol should help with the aches and pains.  Please follow-up with your primary care doctor for further evaluation.  You may return to the emergency department at anytime for any worsening symptoms.

## 2024-02-05 IMAGING — CT CT RENAL STONE PROTOCOL
2 of 4 series · 17 of 46 positions shown, 19 images · non-contrast
Comparison: 11/21/2019

CLINICAL DATA: Flank pain with kidney stone suspected



[Series 2: axial st · axial · 0.98mm/px · z∈[+918,+1324]mm · 14 of 89 slices shown, 16 images]
[im 4/89  soft-tissue]
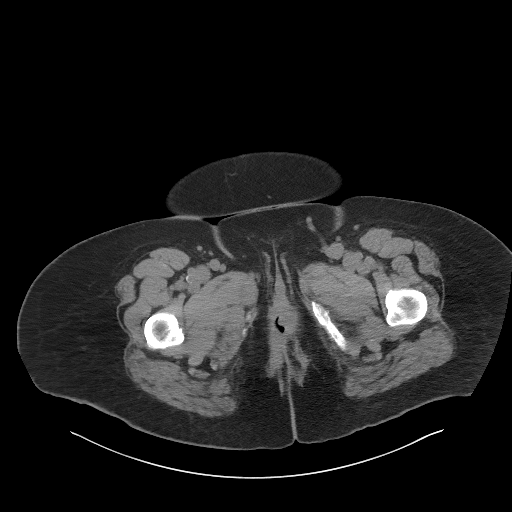
[im 4/89  bone]
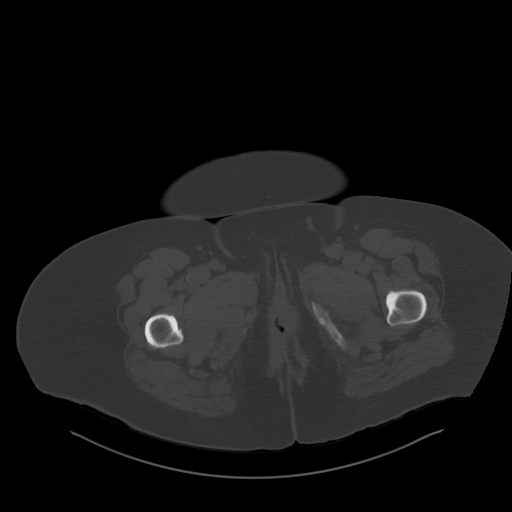
[im 11/89  soft-tissue]
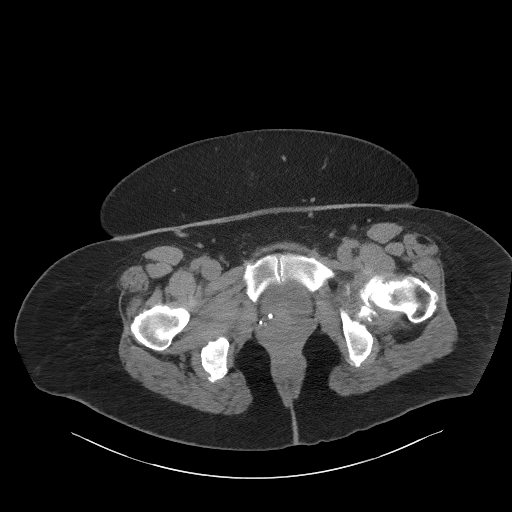
[im 17/89  soft-tissue]
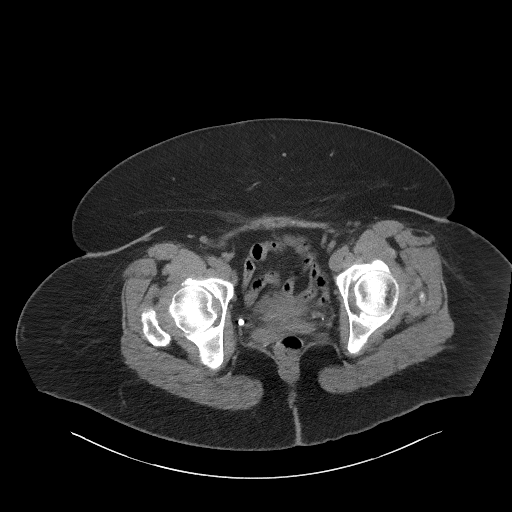
[im 24/89  soft-tissue]
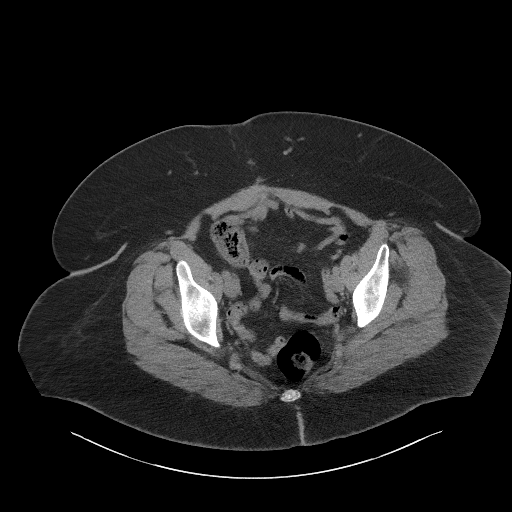
[im 31/89  soft-tissue]
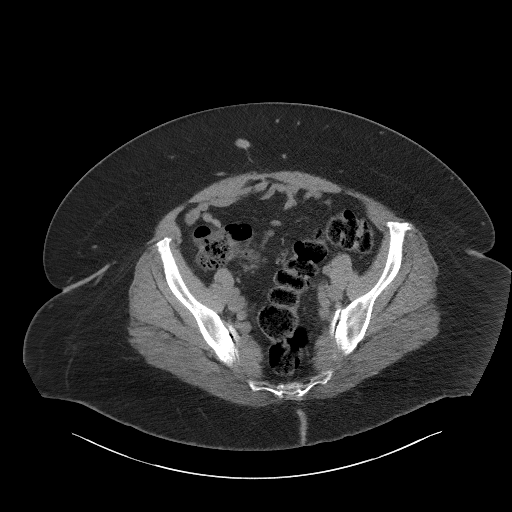
[im 34/89  soft-tissue]
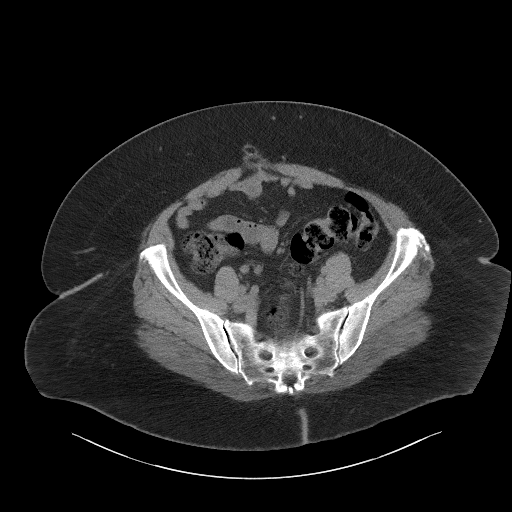
[im 41/89  soft-tissue]
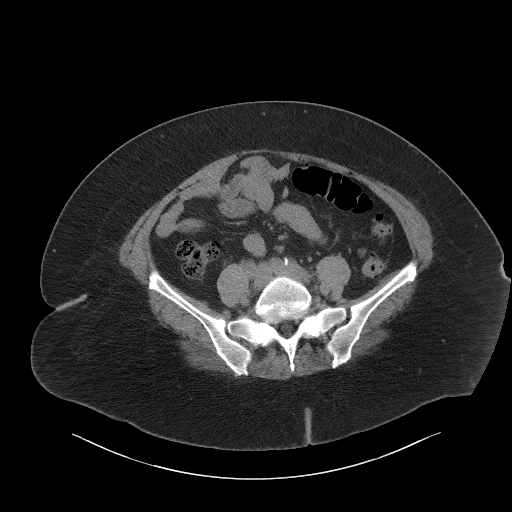
[im 48/89  soft-tissue]
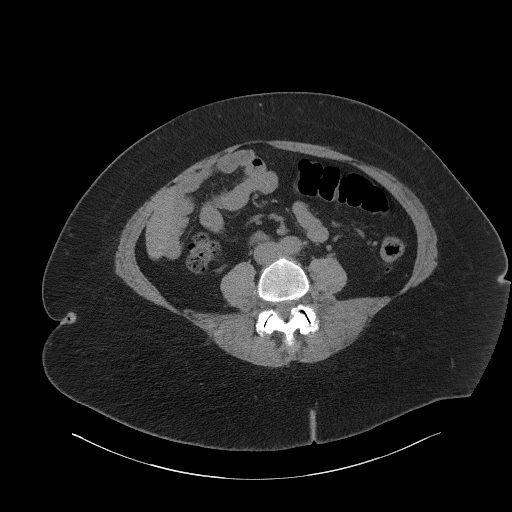
[im 55/89  soft-tissue]
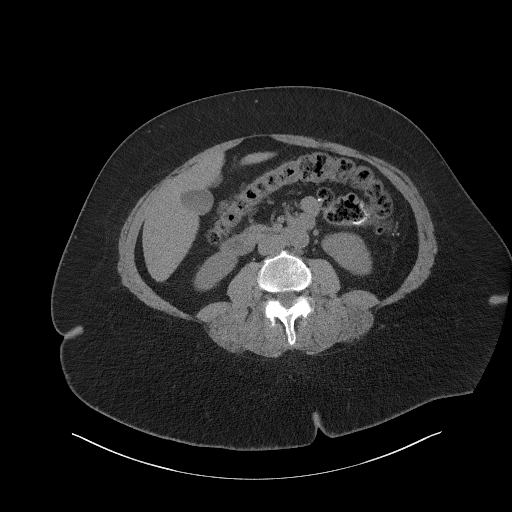
[im 55/89  bone]
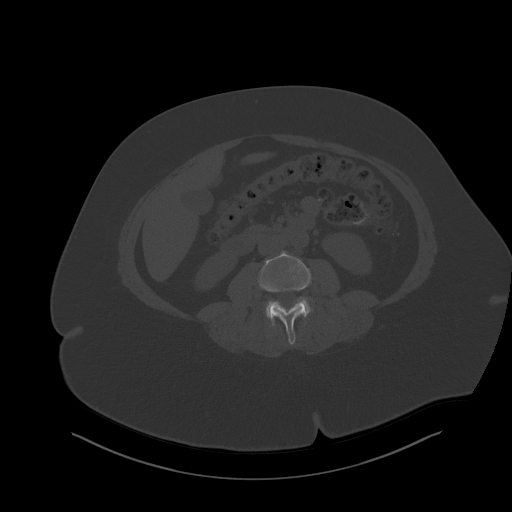
[im 58/89  soft-tissue]
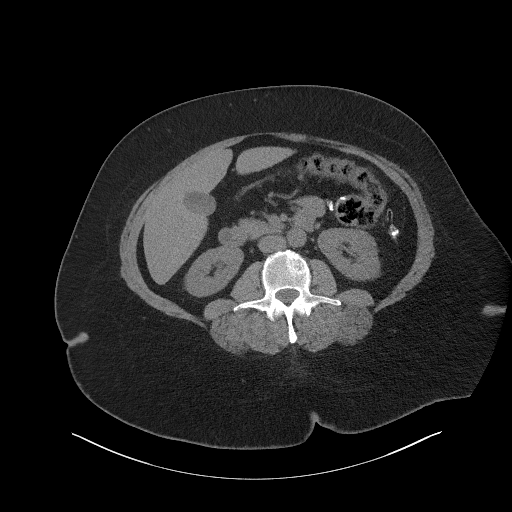
[im 65/89  soft-tissue]
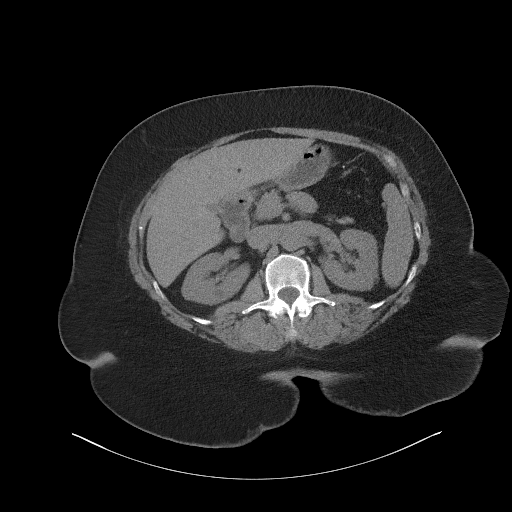
[im 72/89  soft-tissue]
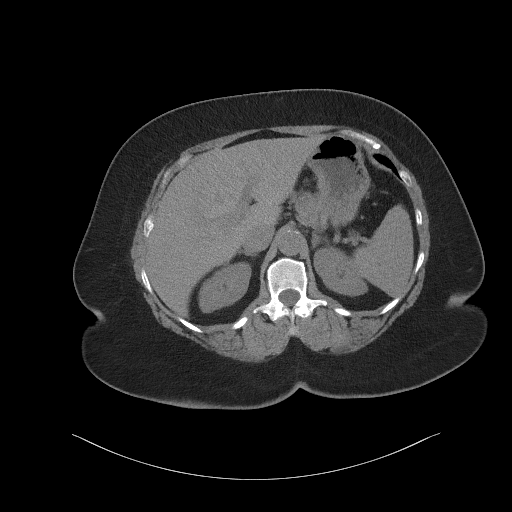
[im 78/89  soft-tissue]
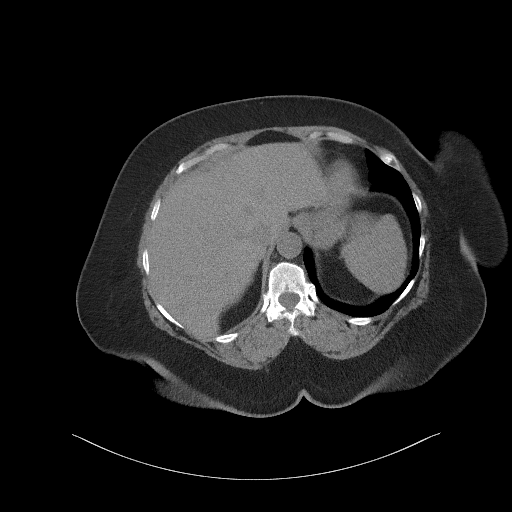
[im 85/89  soft-tissue]
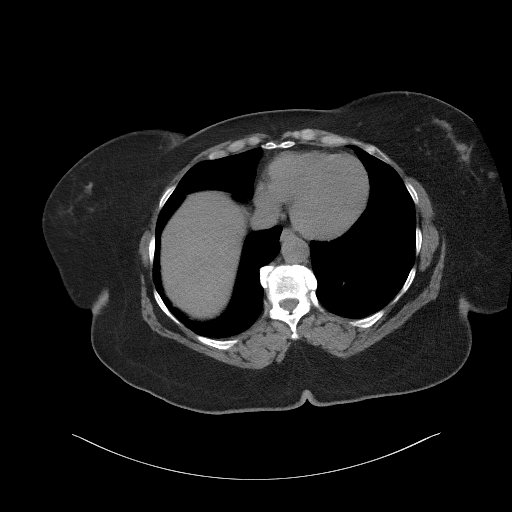

[Series 4: coronal st · coronal · 0.86mm/px · 3 of 104 slices shown]
[im 35/104  soft-tissue]
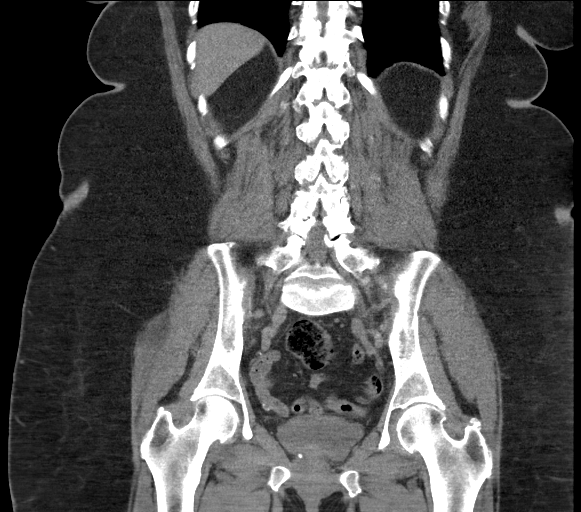
[im 46/104  soft-tissue]
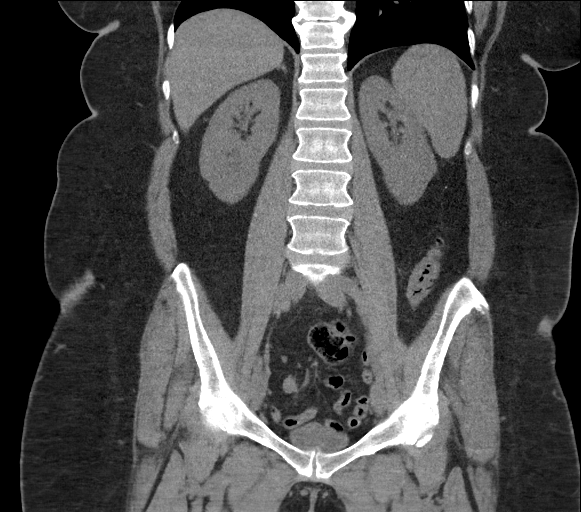
[im 58/104  soft-tissue]
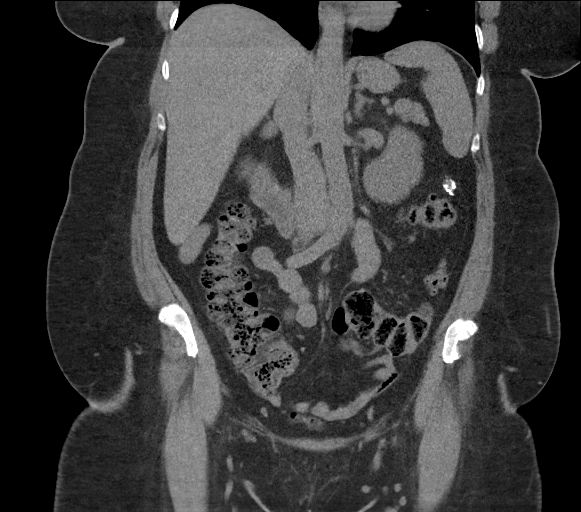

[17 of 46 positions shown; findings below may reference images not displayed]

FINDINGS: Lower chest:  No contributory findings.

Hepatobiliary: No focal liver abnormality.Vague increased density in
the dependent gallbladder but no definite/calcified stones. No
evidence of pericholecystic inflammation.

Pancreas: Unremarkable.

Spleen: Unremarkable.

Adrenals/Urinary Tract: Negative adrenals. No hydronephrosis or
stone. Unremarkable bladder.

Stomach/Bowel: No obstruction. No appendicitis. Colo colonic
anastomosis in the left upper quadrant. Scattered colonic
diverticula.

Vascular/Lymphatic: No acute vascular abnormality. Atheromatous
calcification which is mild. No mass or adenopathy.

Reproductive:Hysterectomy.

Other: No ascites or pneumoperitoneum.

Musculoskeletal: No acute finding. Dedicated lumbar spine reformats
described separately.
IMPRESSION: No acute finding.  No hydronephrosis or urolithiasis.
